# Patient Record
Sex: Female | Born: 1960
Health system: Southern US, Community
[De-identification: ages and names within clinical notes are randomized; demographics above are authoritative.]

---

## 1998-04-23 ENCOUNTER — Other Ambulatory Visit: Admission: RE | Admit: 1998-04-23 | Discharge: 1998-04-23 | Payer: Self-pay | Admitting: Family Medicine

## 1999-10-28 ENCOUNTER — Other Ambulatory Visit: Admission: RE | Admit: 1999-10-28 | Discharge: 1999-10-28 | Payer: Self-pay | Admitting: Family Medicine

## 1999-10-30 ENCOUNTER — Encounter (INDEPENDENT_AMBULATORY_CARE_PROVIDER_SITE_OTHER): Payer: Self-pay | Admitting: *Deleted

## 1999-10-30 ENCOUNTER — Encounter: Payer: Self-pay | Admitting: Family Medicine

## 1999-10-30 ENCOUNTER — Encounter: Admission: RE | Admit: 1999-10-30 | Discharge: 1999-10-30 | Payer: Self-pay | Admitting: Family Medicine

## 2000-10-31 ENCOUNTER — Encounter: Admission: RE | Admit: 2000-10-31 | Discharge: 2000-10-31 | Payer: Self-pay | Admitting: Family Medicine

## 2000-10-31 ENCOUNTER — Other Ambulatory Visit: Admission: RE | Admit: 2000-10-31 | Discharge: 2000-10-31 | Payer: Self-pay | Admitting: Family Medicine

## 2000-10-31 ENCOUNTER — Encounter: Payer: Self-pay | Admitting: Family Medicine

## 2001-11-01 ENCOUNTER — Other Ambulatory Visit: Admission: RE | Admit: 2001-11-01 | Discharge: 2001-11-01 | Payer: Self-pay | Admitting: Family Medicine

## 2001-11-03 ENCOUNTER — Encounter: Payer: Self-pay | Admitting: Family Medicine

## 2001-11-03 ENCOUNTER — Encounter: Admission: RE | Admit: 2001-11-03 | Discharge: 2001-11-03 | Payer: Self-pay | Admitting: Family Medicine

## 2002-11-06 ENCOUNTER — Encounter: Admission: RE | Admit: 2002-11-06 | Discharge: 2002-11-06 | Payer: Self-pay | Admitting: Family Medicine

## 2003-11-19 ENCOUNTER — Encounter: Admission: RE | Admit: 2003-11-19 | Discharge: 2003-11-19 | Payer: Self-pay | Admitting: Family Medicine

## 2004-12-24 ENCOUNTER — Encounter: Admission: RE | Admit: 2004-12-24 | Discharge: 2004-12-24 | Payer: Self-pay | Admitting: Family Medicine

## 2005-08-19 ENCOUNTER — Other Ambulatory Visit: Admission: RE | Admit: 2005-08-19 | Discharge: 2005-08-19 | Payer: Self-pay | Admitting: Family Medicine

## 2005-12-28 ENCOUNTER — Encounter: Admission: RE | Admit: 2005-12-28 | Discharge: 2005-12-28 | Payer: Self-pay | Admitting: Family Medicine

## 2007-01-23 ENCOUNTER — Encounter: Admission: RE | Admit: 2007-01-23 | Discharge: 2007-01-23 | Payer: Self-pay | Admitting: Family Medicine

## 2007-10-19 ENCOUNTER — Other Ambulatory Visit: Admission: RE | Admit: 2007-10-19 | Discharge: 2007-10-19 | Payer: Self-pay | Admitting: Family Medicine

## 2008-01-25 ENCOUNTER — Encounter: Admission: RE | Admit: 2008-01-25 | Discharge: 2008-01-25 | Payer: Self-pay | Admitting: Family Medicine

## 2009-01-28 ENCOUNTER — Encounter: Admission: RE | Admit: 2009-01-28 | Discharge: 2009-01-28 | Payer: Self-pay | Admitting: Family Medicine

## 2010-02-18 ENCOUNTER — Other Ambulatory Visit: Payer: Self-pay | Admitting: Family Medicine

## 2010-02-18 DIAGNOSIS — Z1231 Encounter for screening mammogram for malignant neoplasm of breast: Secondary | ICD-10-CM

## 2010-03-04 ENCOUNTER — Ambulatory Visit
Admission: RE | Admit: 2010-03-04 | Discharge: 2010-03-04 | Disposition: A | Discharge status: Home / Self Care | Payer: 59 | Source: Ambulatory Visit | Attending: Family Medicine | Admitting: Family Medicine

## 2010-03-04 DIAGNOSIS — Z1231 Encounter for screening mammogram for malignant neoplasm of breast: Secondary | ICD-10-CM

## 2011-02-24 ENCOUNTER — Other Ambulatory Visit: Payer: Self-pay | Admitting: Family Medicine

## 2011-02-24 DIAGNOSIS — Z1231 Encounter for screening mammogram for malignant neoplasm of breast: Secondary | ICD-10-CM

## 2011-03-08 ENCOUNTER — Ambulatory Visit
Admission: RE | Admit: 2011-03-08 | Discharge: 2011-03-08 | Disposition: A | Discharge status: Home / Self Care | Payer: 59 | Source: Ambulatory Visit | Attending: Family Medicine | Admitting: Family Medicine

## 2011-03-08 DIAGNOSIS — Z1231 Encounter for screening mammogram for malignant neoplasm of breast: Secondary | ICD-10-CM

## 2012-02-28 ENCOUNTER — Other Ambulatory Visit: Payer: Self-pay | Admitting: Family Medicine

## 2012-02-28 DIAGNOSIS — Z1231 Encounter for screening mammogram for malignant neoplasm of breast: Secondary | ICD-10-CM

## 2012-03-09 ENCOUNTER — Ambulatory Visit
Admission: RE | Admit: 2012-03-09 | Discharge: 2012-03-09 | Disposition: A | Discharge status: Home / Self Care | Payer: 59 | Source: Ambulatory Visit | Attending: Family Medicine | Admitting: Family Medicine

## 2012-03-09 DIAGNOSIS — Z1231 Encounter for screening mammogram for malignant neoplasm of breast: Secondary | ICD-10-CM

## 2012-03-13 ENCOUNTER — Other Ambulatory Visit: Payer: Self-pay | Admitting: Family Medicine

## 2012-03-13 DIAGNOSIS — R928 Other abnormal and inconclusive findings on diagnostic imaging of breast: Secondary | ICD-10-CM

## 2012-03-23 ENCOUNTER — Ambulatory Visit
Admission: RE | Admit: 2012-03-23 | Discharge: 2012-03-23 | Disposition: A | Discharge status: Home / Self Care | Payer: 59 | Source: Ambulatory Visit | Attending: Family Medicine | Admitting: Family Medicine

## 2012-03-23 DIAGNOSIS — R928 Other abnormal and inconclusive findings on diagnostic imaging of breast: Secondary | ICD-10-CM

## 2012-08-29 ENCOUNTER — Other Ambulatory Visit: Payer: Self-pay | Admitting: Family Medicine

## 2012-08-29 DIAGNOSIS — N6310 Unspecified lump in the right breast, unspecified quadrant: Secondary | ICD-10-CM

## 2012-09-14 ENCOUNTER — Ambulatory Visit
Admission: RE | Admit: 2012-09-14 | Discharge: 2012-09-14 | Disposition: A | Discharge status: Home / Self Care | Payer: 59 | Source: Ambulatory Visit | Attending: Family Medicine | Admitting: Family Medicine

## 2012-09-14 ENCOUNTER — Other Ambulatory Visit: Payer: Self-pay | Admitting: Family Medicine

## 2012-09-14 DIAGNOSIS — N6310 Unspecified lump in the right breast, unspecified quadrant: Secondary | ICD-10-CM

## 2013-03-28 ENCOUNTER — Other Ambulatory Visit: Payer: Self-pay

## 2013-03-28 DIAGNOSIS — Z1231 Encounter for screening mammogram for malignant neoplasm of breast: Secondary | ICD-10-CM

## 2013-04-06 ENCOUNTER — Ambulatory Visit
Admission: RE | Admit: 2013-04-06 | Discharge: 2013-04-06 | Disposition: A | Discharge status: Home / Self Care | Payer: 59 | Source: Ambulatory Visit

## 2013-04-06 DIAGNOSIS — Z1231 Encounter for screening mammogram for malignant neoplasm of breast: Secondary | ICD-10-CM

## 2014-03-12 ENCOUNTER — Other Ambulatory Visit: Payer: Self-pay

## 2014-03-12 DIAGNOSIS — Z1231 Encounter for screening mammogram for malignant neoplasm of breast: Secondary | ICD-10-CM

## 2014-04-09 ENCOUNTER — Ambulatory Visit
Admission: RE | Admit: 2014-04-09 | Discharge: 2014-04-09 | Disposition: A | Discharge status: Home / Self Care | Payer: 59 | Source: Ambulatory Visit

## 2014-04-09 DIAGNOSIS — Z1231 Encounter for screening mammogram for malignant neoplasm of breast: Secondary | ICD-10-CM

## 2015-03-28 ENCOUNTER — Other Ambulatory Visit: Payer: Self-pay

## 2015-03-28 DIAGNOSIS — Z1231 Encounter for screening mammogram for malignant neoplasm of breast: Secondary | ICD-10-CM

## 2015-04-10 ENCOUNTER — Ambulatory Visit
Admission: RE | Admit: 2015-04-10 | Discharge: 2015-04-10 | Disposition: A | Discharge status: Home / Self Care | Payer: 59 | Source: Ambulatory Visit

## 2015-04-10 DIAGNOSIS — Z1231 Encounter for screening mammogram for malignant neoplasm of breast: Secondary | ICD-10-CM

## 2016-04-06 ENCOUNTER — Other Ambulatory Visit: Payer: Self-pay | Admitting: Family Medicine

## 2016-04-06 DIAGNOSIS — Z1231 Encounter for screening mammogram for malignant neoplasm of breast: Secondary | ICD-10-CM

## 2016-04-23 ENCOUNTER — Ambulatory Visit
Admission: RE | Admit: 2016-04-23 | Discharge: 2016-04-23 | Disposition: A | Discharge status: Home / Self Care | Payer: 59 | Source: Ambulatory Visit | Attending: Family Medicine | Admitting: Family Medicine

## 2016-04-23 DIAGNOSIS — Z1231 Encounter for screening mammogram for malignant neoplasm of breast: Secondary | ICD-10-CM

## 2017-03-29 ENCOUNTER — Other Ambulatory Visit: Payer: Self-pay

## 2017-03-29 DIAGNOSIS — Y939 Activity, unspecified: Secondary | ICD-10-CM | POA: Diagnosis not present

## 2017-03-29 DIAGNOSIS — Y999 Unspecified external cause status: Secondary | ICD-10-CM | POA: Diagnosis not present

## 2017-03-29 DIAGNOSIS — S01112A Laceration without foreign body of left eyelid and periocular area, initial encounter: Secondary | ICD-10-CM | POA: Diagnosis present

## 2017-03-29 DIAGNOSIS — Z23 Encounter for immunization: Secondary | ICD-10-CM | POA: Insufficient documentation

## 2017-03-29 DIAGNOSIS — W01190A Fall on same level from slipping, tripping and stumbling with subsequent striking against furniture, initial encounter: Secondary | ICD-10-CM | POA: Diagnosis not present

## 2017-03-29 DIAGNOSIS — Y929 Unspecified place or not applicable: Secondary | ICD-10-CM | POA: Insufficient documentation

## 2017-03-30 ENCOUNTER — Emergency Department (HOSPITAL_COMMUNITY)
Admission: EM | Admit: 2017-03-30 | Discharge: 2017-03-30 | Disposition: A | Discharge status: Home / Self Care | Payer: 59 | Attending: Emergency Medicine | Admitting: Emergency Medicine

## 2017-03-30 ENCOUNTER — Encounter (HOSPITAL_COMMUNITY): Payer: Self-pay | Admitting: Emergency Medicine

## 2017-03-30 DIAGNOSIS — S0181XA Laceration without foreign body of other part of head, initial encounter: Secondary | ICD-10-CM

## 2017-03-30 MED ORDER — LIDOCAINE-EPINEPHRINE (PF) 2 %-1:200000 IJ SOLN
20.0000 mL | Freq: Once | INTRAMUSCULAR | Status: AC
  Administered 2017-03-30: 20 mL
  Filled 2017-03-30: qty 20

## 2017-03-30 MED ORDER — TETANUS-DIPHTH-ACELL PERTUSSIS 5-2.5-18.5 LF-MCG/0.5 IM SUSP
0.5000 mL | Freq: Once | INTRAMUSCULAR | Status: AC
  Administered 2017-03-30: 0.5 mL via INTRAMUSCULAR
  Filled 2017-03-30: qty 0.5

## 2017-03-30 NOTE — ED Provider Notes (Signed)
[facility]   CSN: [id number] Arrival date & time: 03/29/17  2345     History   Chief Complaint Chief Complaint  Patient presents with  . Laceration  . Fall    HPI Samantha Gallegos is a 57 y.o. female.  HPI  This is a 57 year old female who presents with a facial laceration.  Patient reports that she was bending over when she tripped and fell into a stool.  She denies loss of consciousness.  She does not take any anticoagulation.  She reports a laceration to the left lateral eye.  Denies vision changes.  Denies significant pain.  Denies other injury.  Unknown last tetanus shot.  History reviewed. No pertinent past medical history.  There are no active problems to display for this patient.   History reviewed. No pertinent surgical history.  OB History    No data available       Home Medications    Prior to Admission medications   Not on File    Family History No family history on file.  Social History Social History   Tobacco Use  . Smoking status: Never Smoker  . Smokeless tobacco: Never Used  Substance Use Topics  . Alcohol use: No    Frequency: Never  . Drug use: No     Allergies   Patient has no known allergies.   Review of Systems Review of Systems  Skin: Positive for wound.  Neurological: Negative for dizziness and headaches.  All other systems reviewed and are negative.    Physical Exam Updated Vital Signs BP (!) 149/83 (BP Location: Right Arm)   Pulse 96   Temp 98.8 F (37.1 C) (Oral)   Resp 18   Ht 5' 5.5" (1.664 m)   Wt 104.3 kg (230 lb)   SpO2 100%   BMI 37.69 kg/m   Physical Exam  Constitutional: She is oriented to person, place, and time. She appears well-developed and well-nourished.  HENT:  Head: Normocephalic.  7 cm gaping laceration over the left lateral eyebrow, no significant bleeding  Eyes: EOM are normal. Pupils are equal, round, and reactive to light.    Cardiovascular: Normal rate, regular rhythm and normal heart sounds.  Pulmonary/Chest: Effort normal. No respiratory distress. She has no wheezes.  Neurological: She is alert and oriented to person, place, and time.  Skin: Skin is warm and dry.  Psychiatric: She has a normal mood and affect.  Nursing note and vitals reviewed.    ED Treatments / Results  Labs (all labs ordered are listed, but only abnormal results are displayed) Labs Reviewed - No data to display  EKG  EKG Interpretation None       Radiology No results found.  Procedures .Marland Kitchen.Laceration Repair Date/Time: 03/30/2017 2:33 AM Performed by: Shon BatonHorton, Lorelle Macaluso F, MD Authorized by: Shon BatonHorton, Tarrell Debes F, MD   Consent:    Consent obtained:  Verbal   Consent given by:  Patient   Risks discussed:  Infection, pain and poor cosmetic result   Alternatives discussed:  No treatment Anesthesia (see MAR for exact dosages):    Anesthesia method:  Local infiltration   Local anesthetic:  Lidocaine 2% WITH epi Laceration details:    Location:  Face   Face location:  L eyebrow   Length (cm):  7   Depth (mm):  8 Repair type:    Repair type:  Intermediate Pre-procedure details:    Preparation:  Patient was prepped and draped in usual  sterile fashion Exploration:    Hemostasis achieved with:  Epinephrine   Wound extent: no muscle damage noted, no nerve damage noted and no vascular damage noted     Contaminated: no   Treatment:    Area cleansed with:  Betadine   Amount of cleaning:  Standard   Irrigation solution:  Sterile saline   Irrigation volume:  250   Irrigation method:  Syringe   Visualized foreign bodies/material removed: no   Subcutaneous repair:    Suture size:  5-0   Suture material:  Vicryl   Suture technique:  Simple interrupted   Number of sutures:  2 Skin repair:    Repair method:  Sutures   Suture size:  5-0   Suture material:  Fast-absorbing gut   Suture technique:  Simple interrupted   Number of  sutures:  7 Approximation:    Approximation:  Close Post-procedure details:    Dressing:  Open (no dressing)   Patient tolerance of procedure:  Tolerated well, no immediate complications   (including critical care time)  Medications Ordered in ED Medications  lidocaine-EPINEPHrine (XYLOCAINE W/EPI) 2 %-1:200000 (PF) injection 20 mL (20 mLs Infiltration Given 03/30/17 0157)  Tdap (BOOSTRIX) injection 0.5 mL (0.5 mLs Intramuscular Given 03/30/17 0157)     Initial Impression / Assessment and Plan / ED Course  I have reviewed the triage vital signs and the nursing notes.  Pertinent labs & imaging results that were available during my care of the patient were reviewed by me and considered in my medical decision making (see chart for details).     Patient presents with a laceration of the face.  She is low risk for intracranial injury.  Tetanus was updated.  Wound was repaired at the bedside.  Patient was advised regarding wound care and scar prevention.  After history, exam, and medical workup I feel the patient has been appropriately medically screened and is safe for discharge home. Pertinent diagnoses were discussed with the patient. Patient was given return precautions.  Final Clinical Impressions(s) / ED Diagnoses   Final diagnoses:  Facial laceration, initial encounter    ED Discharge Orders    None       Nikitha Mode, Mayer Masker, MD 03/30/17 [phone]

## 2017-03-30 NOTE — ED Notes (Signed)
E-signature pad not available; patient and pt's husband verbalized understanding.

## 2017-03-30 NOTE — ED Triage Notes (Addendum)
Pt reports fall into wooden stool today. No LOC but 5-6 cm laceration with un approximated edges, skull exposed. Needs tDAP updated

## 2017-03-30 NOTE — Discharge Instructions (Signed)
You were seen today for facial laceration.  This was repaired with absorbable sutures.  They will fall out on their own.  Make sure to keep antibiotic ointment on the laceration.  Once healed, you need to use sunscreen for the next year to avoid worsening scarring.

## 2017-04-12 ENCOUNTER — Other Ambulatory Visit: Payer: Self-pay | Admitting: Family Medicine

## 2017-04-12 DIAGNOSIS — Z1231 Encounter for screening mammogram for malignant neoplasm of breast: Secondary | ICD-10-CM

## 2017-04-29 ENCOUNTER — Ambulatory Visit
Admission: RE | Admit: 2017-04-29 | Discharge: 2017-04-29 | Disposition: A | Discharge status: Home / Self Care | Payer: 59 | Source: Ambulatory Visit | Attending: Family Medicine | Admitting: Family Medicine

## 2017-04-29 DIAGNOSIS — Z1231 Encounter for screening mammogram for malignant neoplasm of breast: Secondary | ICD-10-CM

## 2018-03-23 ENCOUNTER — Other Ambulatory Visit: Payer: Self-pay | Admitting: Family Medicine

## 2018-03-23 DIAGNOSIS — Z1231 Encounter for screening mammogram for malignant neoplasm of breast: Secondary | ICD-10-CM

## 2018-05-02 ENCOUNTER — Ambulatory Visit: Payer: 59

## 2018-06-14 ENCOUNTER — Ambulatory Visit
Admission: RE | Admit: 2018-06-14 | Discharge: 2018-06-14 | Disposition: A | Discharge status: Home / Self Care | Payer: 59 | Source: Ambulatory Visit | Attending: Family Medicine | Admitting: Family Medicine

## 2018-06-14 ENCOUNTER — Other Ambulatory Visit: Payer: Self-pay

## 2018-06-14 DIAGNOSIS — Z1231 Encounter for screening mammogram for malignant neoplasm of breast: Secondary | ICD-10-CM

## 2018-06-15 ENCOUNTER — Other Ambulatory Visit: Payer: Self-pay | Admitting: Family Medicine

## 2018-06-15 DIAGNOSIS — R928 Other abnormal and inconclusive findings on diagnostic imaging of breast: Secondary | ICD-10-CM

## 2018-06-20 ENCOUNTER — Other Ambulatory Visit: Payer: 59

## 2018-06-26 ENCOUNTER — Other Ambulatory Visit: Payer: Self-pay | Admitting: Family Medicine

## 2018-06-26 DIAGNOSIS — R928 Other abnormal and inconclusive findings on diagnostic imaging of breast: Secondary | ICD-10-CM

## 2018-06-29 ENCOUNTER — Other Ambulatory Visit: Payer: 59

## 2018-07-04 ENCOUNTER — Other Ambulatory Visit: Payer: 59

## 2018-07-04 ENCOUNTER — Other Ambulatory Visit: Payer: Self-pay | Admitting: Family Medicine

## 2018-07-04 ENCOUNTER — Ambulatory Visit
Admission: RE | Admit: 2018-07-04 | Discharge: 2018-07-04 | Disposition: A | Discharge status: Home / Self Care | Payer: 59 | Source: Ambulatory Visit | Attending: Family Medicine | Admitting: Family Medicine

## 2018-07-04 DIAGNOSIS — N6310 Unspecified lump in the right breast, unspecified quadrant: Secondary | ICD-10-CM

## 2018-07-04 DIAGNOSIS — R928 Other abnormal and inconclusive findings on diagnostic imaging of breast: Secondary | ICD-10-CM

## 2018-10-24 ENCOUNTER — Other Ambulatory Visit: Payer: Self-pay | Admitting: Family Medicine

## 2018-10-24 DIAGNOSIS — E2839 Other primary ovarian failure: Secondary | ICD-10-CM

## 2018-11-01 ENCOUNTER — Other Ambulatory Visit: Payer: Self-pay | Admitting: Family Medicine

## 2018-11-01 ENCOUNTER — Other Ambulatory Visit (HOSPITAL_COMMUNITY)
Admission: RE | Admit: 2018-11-01 | Discharge: 2018-11-01 | Disposition: A | Discharge status: Home / Self Care | Payer: 59 | Source: Ambulatory Visit | Attending: Family Medicine | Admitting: Family Medicine

## 2018-11-01 DIAGNOSIS — Z0000 Encounter for general adult medical examination without abnormal findings: Secondary | ICD-10-CM | POA: Insufficient documentation

## 2018-11-03 LAB — CYTOLOGY - PAP
Comment: NEGATIVE
Diagnosis: NEGATIVE
High risk HPV: NEGATIVE

## 2019-01-09 ENCOUNTER — Other Ambulatory Visit: Payer: Self-pay

## 2019-01-09 ENCOUNTER — Ambulatory Visit
Admission: RE | Admit: 2019-01-09 | Discharge: 2019-01-09 | Disposition: A | Discharge status: Home / Self Care | Payer: 59 | Source: Ambulatory Visit | Attending: Family Medicine | Admitting: Family Medicine

## 2019-01-09 ENCOUNTER — Other Ambulatory Visit: Payer: Self-pay | Admitting: Family Medicine

## 2019-01-09 DIAGNOSIS — N6310 Unspecified lump in the right breast, unspecified quadrant: Secondary | ICD-10-CM

## 2019-01-16 ENCOUNTER — Other Ambulatory Visit: Payer: Self-pay

## 2019-01-16 ENCOUNTER — Ambulatory Visit
Admission: RE | Admit: 2019-01-16 | Discharge: 2019-01-16 | Disposition: A | Discharge status: Home / Self Care | Payer: 59 | Source: Ambulatory Visit | Attending: Family Medicine | Admitting: Family Medicine

## 2019-01-16 DIAGNOSIS — N6310 Unspecified lump in the right breast, unspecified quadrant: Secondary | ICD-10-CM

## 2019-02-16 ENCOUNTER — Other Ambulatory Visit: Payer: 59

## 2019-04-12 ENCOUNTER — Other Ambulatory Visit: Payer: 59

## 2019-05-01 ENCOUNTER — Other Ambulatory Visit: Payer: Self-pay | Admitting: Family Medicine

## 2019-05-01 DIAGNOSIS — Z1231 Encounter for screening mammogram for malignant neoplasm of breast: Secondary | ICD-10-CM

## 2019-06-22 ENCOUNTER — Other Ambulatory Visit: Payer: 59

## 2019-06-26 ENCOUNTER — Ambulatory Visit
Admission: RE | Admit: 2019-06-26 | Discharge: 2019-06-26 | Disposition: A | Discharge status: Home / Self Care | Payer: 59 | Source: Ambulatory Visit | Attending: Family Medicine | Admitting: Family Medicine

## 2019-06-26 ENCOUNTER — Other Ambulatory Visit: Payer: Self-pay

## 2019-06-26 DIAGNOSIS — Z1231 Encounter for screening mammogram for malignant neoplasm of breast: Secondary | ICD-10-CM

## 2019-06-26 DIAGNOSIS — E2839 Other primary ovarian failure: Secondary | ICD-10-CM

## 2020-06-02 ENCOUNTER — Other Ambulatory Visit: Payer: Self-pay | Admitting: Family Medicine

## 2020-06-02 DIAGNOSIS — Z1231 Encounter for screening mammogram for malignant neoplasm of breast: Secondary | ICD-10-CM

## 2020-07-29 ENCOUNTER — Other Ambulatory Visit: Payer: Self-pay

## 2020-07-29 ENCOUNTER — Ambulatory Visit
Admission: RE | Admit: 2020-07-29 | Discharge: 2020-07-29 | Disposition: A | Discharge status: Home / Self Care | Payer: 59 | Source: Ambulatory Visit | Attending: Family Medicine | Admitting: Family Medicine

## 2020-07-29 DIAGNOSIS — Z1231 Encounter for screening mammogram for malignant neoplasm of breast: Secondary | ICD-10-CM

## 2021-06-08 IMAGING — MG DIGITAL SCREENING BILAT W/ TOMO W/ CAD
8 series · 8 of 24 positions shown · non-contrast
Comparison: Previous exam(s).

CLINICAL DATA: Screening.

EXAM:
DIGITAL SCREENING BILATERAL MAMMOGRAM WITH TOMO AND CAD

[R MLO synth-2D]
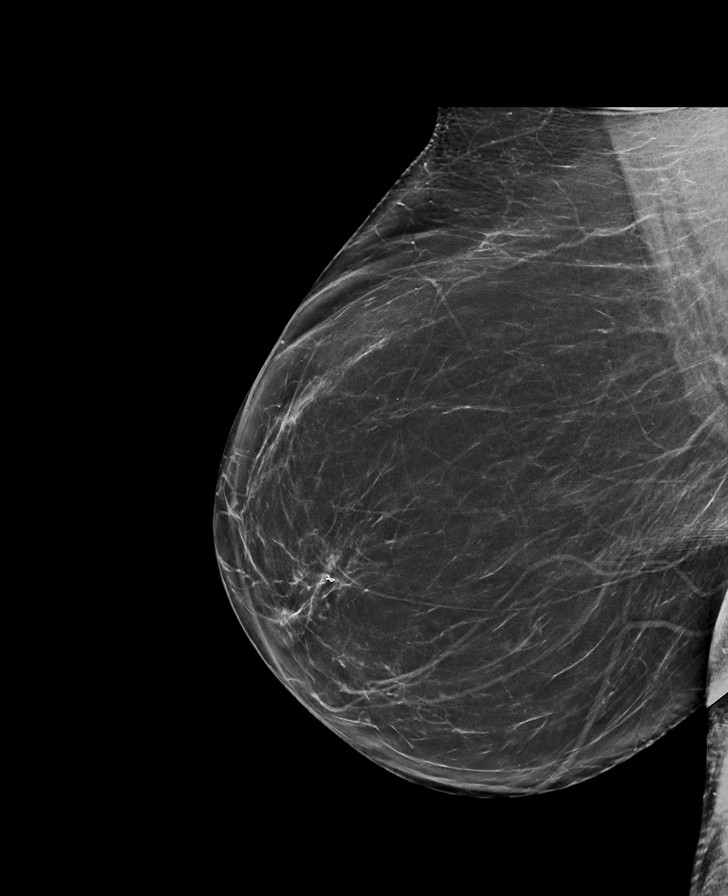

[L CC synth-2D]
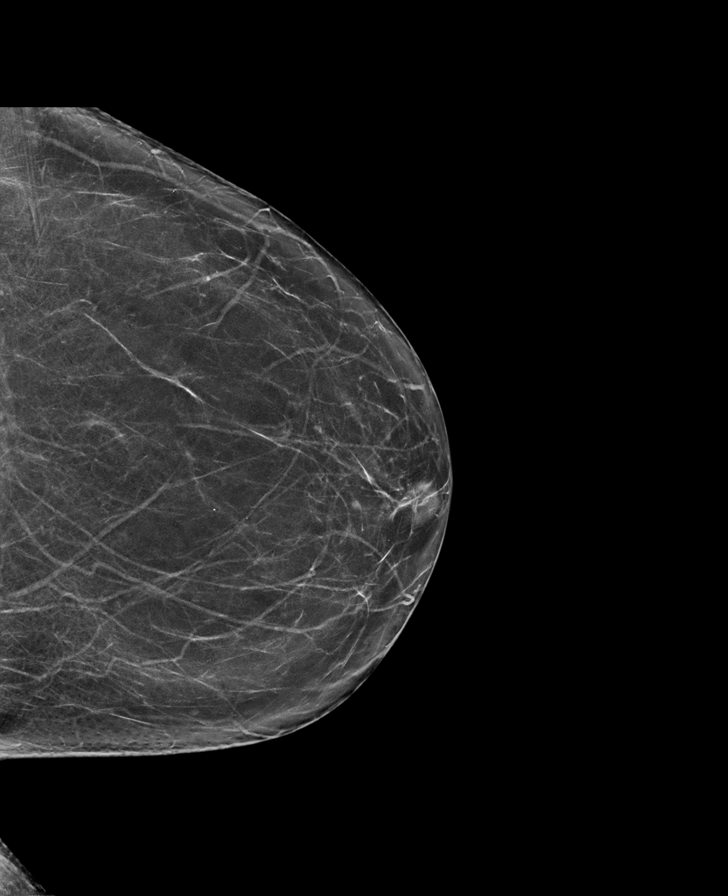

[L MLO synth-2D]
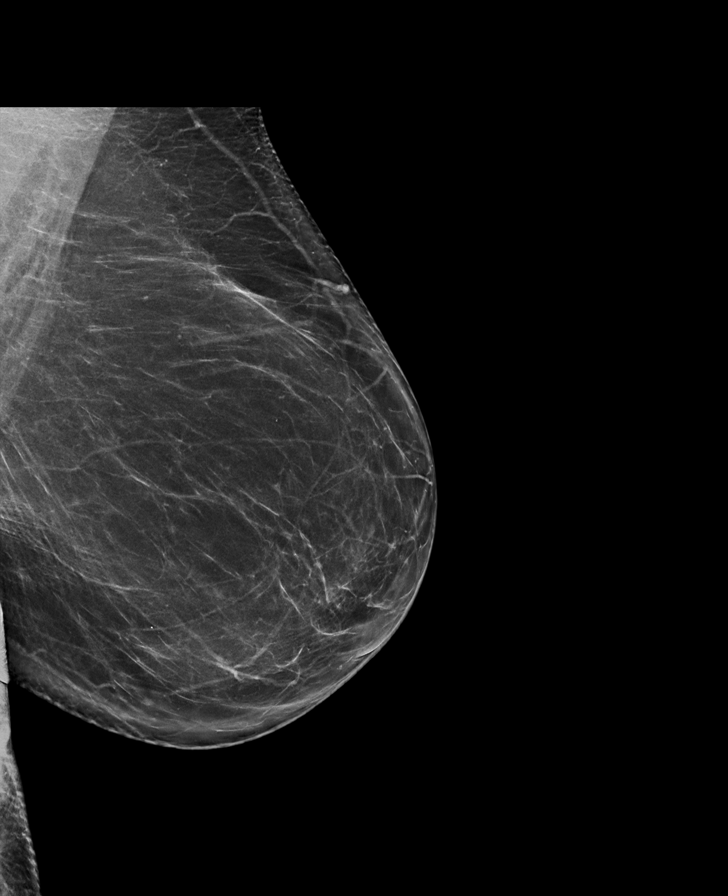

[R CC synth-2D]
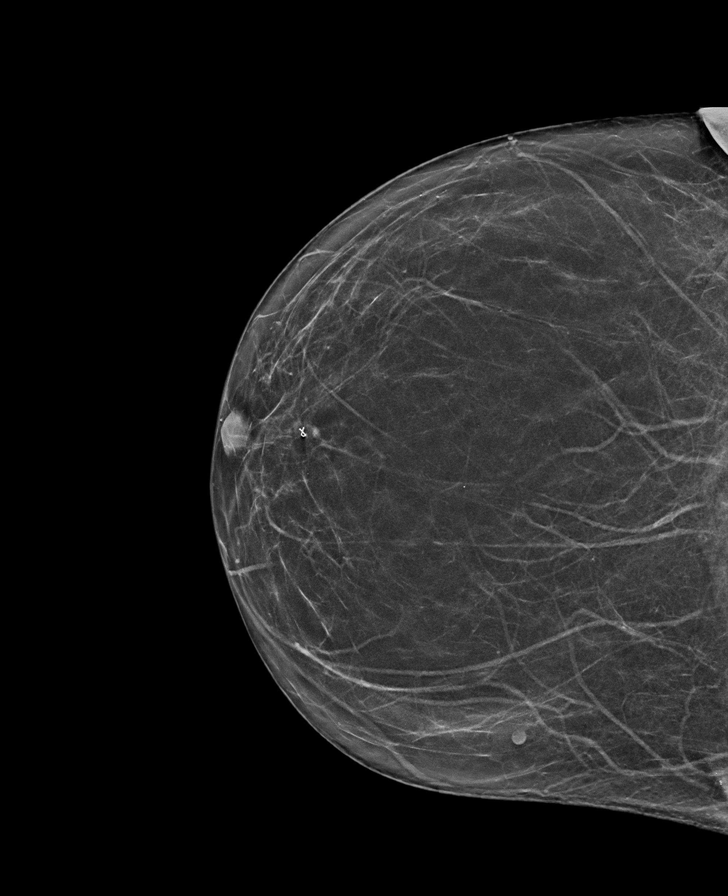

[L CC tomo · tomo slice 37/72.0]
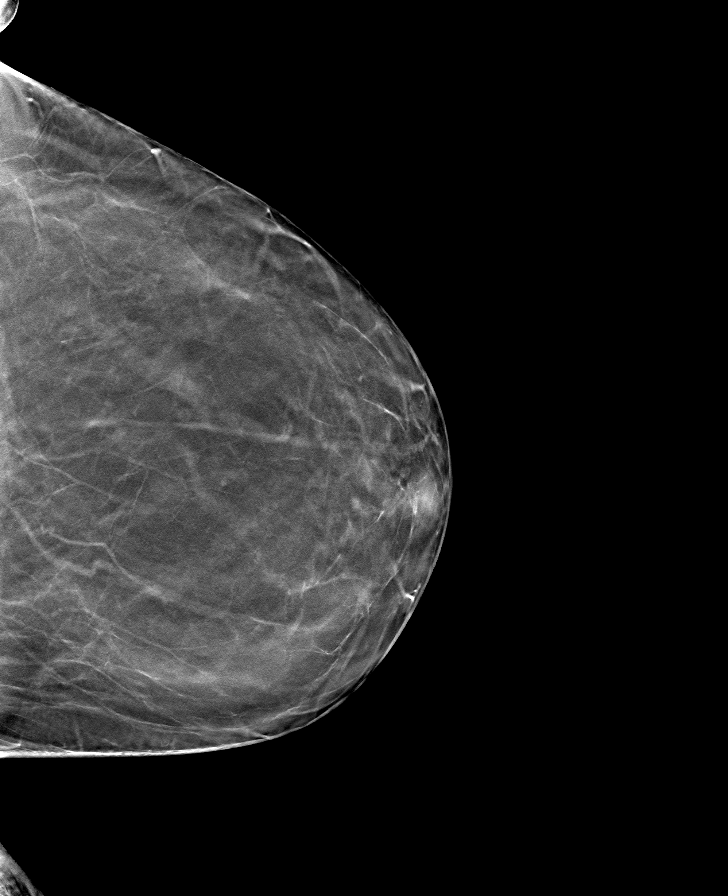

[R CC tomo · tomo slice 34/67.0]
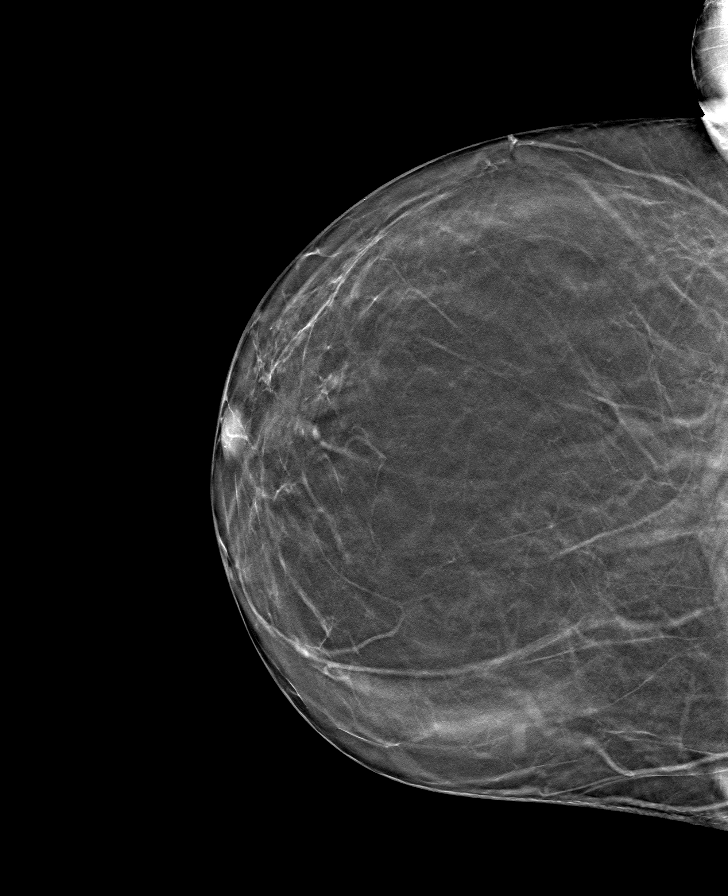

[L MLO tomo · tomo slice 39/78.0]
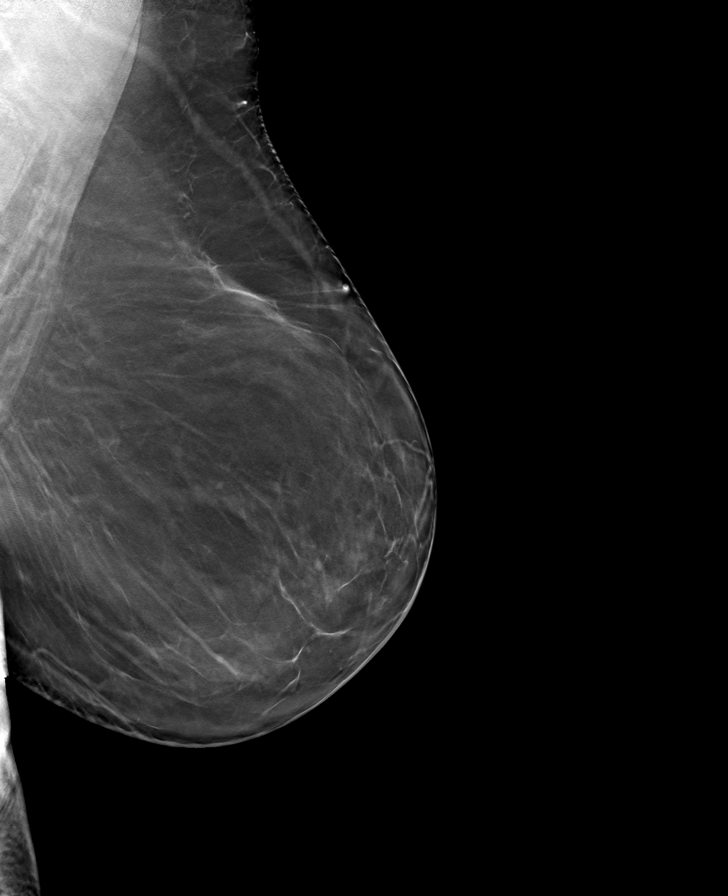

[R MLO tomo · tomo slice 39/78.0]
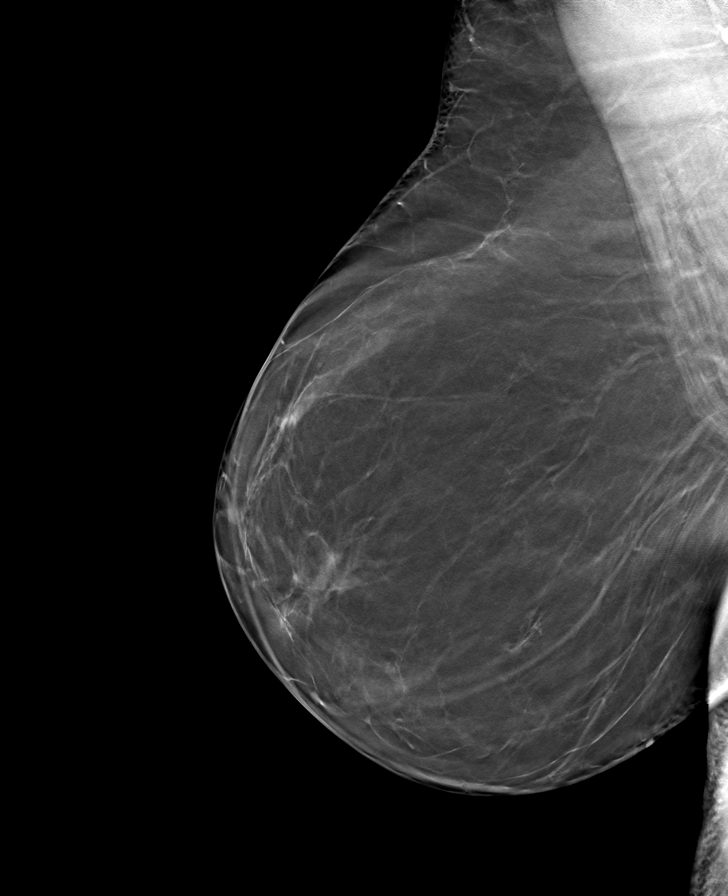

[8 of 24 positions shown; findings below may reference images not displayed]

ACR Breast Density Category b: There are scattered areas of
fibroglandular density.
FINDINGS: There are no findings suspicious for malignancy. Images were
processed with CAD.
IMPRESSION: No mammographic evidence of malignancy. A result letter of this
screening mammogram will be mailed directly to the patient.

RECOMMENDATION:
Screening mammogram in one year. (Code:[id number]-775)

BI-RADS CATEGORY  1: Negative.

## 2021-06-19 ENCOUNTER — Other Ambulatory Visit: Payer: Self-pay | Admitting: Family Medicine

## 2021-06-19 DIAGNOSIS — Z1231 Encounter for screening mammogram for malignant neoplasm of breast: Secondary | ICD-10-CM

## 2021-07-30 ENCOUNTER — Ambulatory Visit
Admission: RE | Admit: 2021-07-30 | Discharge: 2021-07-30 | Disposition: A | Discharge status: Home / Self Care | Payer: 59 | Source: Ambulatory Visit | Attending: Family Medicine | Admitting: Family Medicine

## 2021-07-30 DIAGNOSIS — Z1231 Encounter for screening mammogram for malignant neoplasm of breast: Secondary | ICD-10-CM

## 2022-07-06 ENCOUNTER — Other Ambulatory Visit: Payer: Self-pay | Admitting: Family Medicine

## 2022-07-06 DIAGNOSIS — Z1231 Encounter for screening mammogram for malignant neoplasm of breast: Secondary | ICD-10-CM

## 2022-08-03 ENCOUNTER — Ambulatory Visit
Admission: RE | Admit: 2022-08-03 | Discharge: 2022-08-03 | Disposition: A | Discharge status: Home / Self Care | Payer: 59 | Source: Ambulatory Visit | Attending: Family Medicine | Admitting: Family Medicine

## 2022-08-03 DIAGNOSIS — Z1231 Encounter for screening mammogram for malignant neoplasm of breast: Secondary | ICD-10-CM

## 2022-09-30 ENCOUNTER — Other Ambulatory Visit: Payer: Self-pay

## 2022-09-30 ENCOUNTER — Encounter (HOSPITAL_BASED_OUTPATIENT_CLINIC_OR_DEPARTMENT_OTHER): Payer: Self-pay | Admitting: Emergency Medicine

## 2022-09-30 ENCOUNTER — Emergency Department (HOSPITAL_BASED_OUTPATIENT_CLINIC_OR_DEPARTMENT_OTHER)
Admission: EM | Admit: 2022-09-30 | Discharge: 2022-09-30 | Disposition: A | Discharge status: Home / Self Care | Payer: 59 | Attending: Emergency Medicine | Admitting: Emergency Medicine

## 2022-09-30 DIAGNOSIS — M545 Low back pain, unspecified: Secondary | ICD-10-CM | POA: Diagnosis present

## 2022-09-30 DIAGNOSIS — T148XXA Other injury of unspecified body region, initial encounter: Secondary | ICD-10-CM

## 2022-09-30 DIAGNOSIS — Y92410 Unspecified street and highway as the place of occurrence of the external cause: Secondary | ICD-10-CM | POA: Diagnosis not present

## 2022-09-30 MED ORDER — NAPROXEN 250 MG PO TABS
500.0000 mg | ORAL_TABLET | Freq: Once | ORAL | Status: AC
  Administered 2022-09-30: 500 mg via ORAL
  Filled 2022-09-30: qty 2

## 2022-09-30 MED ORDER — METHOCARBAMOL 500 MG PO TABS: 500.0000 mg | ORAL_TABLET | Freq: Two times a day (BID) | ORAL | 0 refills | Status: AC

## 2022-09-30 MED ORDER — NAPROXEN 500 MG PO TABS: 500.0000 mg | ORAL_TABLET | Freq: Two times a day (BID) | ORAL | 0 refills | Status: AC

## 2022-09-30 NOTE — Discharge Instructions (Addendum)
It was a pleasure caring for you today.  Please see educational handout in discharge paperwork for back pain management.  Please follow-up with your primary care provider in the next 48-72 hours.  Seek emergency care if experiencing any new or worsening symptoms.

## 2022-09-30 NOTE — ED Notes (Signed)
Pt. D/c'd with instructions given.
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
## 2023-06-29 ENCOUNTER — Other Ambulatory Visit: Payer: Self-pay | Admitting: Family Medicine

## 2023-06-29 DIAGNOSIS — Z1231 Encounter for screening mammogram for malignant neoplasm of breast: Secondary | ICD-10-CM

## 2023-08-09 ENCOUNTER — Ambulatory Visit

## 2023-08-11 ENCOUNTER — Inpatient Hospital Stay
Admission: RE | Admit: 2023-08-11 | Discharge: 2023-08-11 | Discharge status: Home / Self Care | Source: Ambulatory Visit | Attending: Family Medicine | Admitting: Family Medicine

## 2023-08-11 DIAGNOSIS — Z1231 Encounter for screening mammogram for malignant neoplasm of breast: Secondary | ICD-10-CM
